# Patient Record
Sex: Male | Born: 1980 | Race: Black or African American | Hispanic: No | Marital: Single | Smoking: Current every day smoker
Health system: Southern US, Community
[De-identification: ages and names within clinical notes are randomized; demographics above are authoritative.]

---

## 2020-05-13 ENCOUNTER — Emergency Department: Payer: Self-pay

## 2020-05-13 ENCOUNTER — Encounter: Payer: Self-pay | Admitting: Emergency Medicine

## 2020-05-13 ENCOUNTER — Other Ambulatory Visit: Payer: Self-pay

## 2020-05-13 ENCOUNTER — Emergency Department
Admission: EM | Admit: 2020-05-13 | Discharge: 2020-05-13 | Disposition: A | Discharge status: Home / Self Care | Payer: Self-pay | Attending: Emergency Medicine | Admitting: Emergency Medicine

## 2020-05-13 DIAGNOSIS — F17210 Nicotine dependence, cigarettes, uncomplicated: Secondary | ICD-10-CM | POA: Insufficient documentation

## 2020-05-13 DIAGNOSIS — M25552 Pain in left hip: Secondary | ICD-10-CM | POA: Insufficient documentation

## 2020-05-13 DIAGNOSIS — R52 Pain, unspecified: Secondary | ICD-10-CM

## 2020-05-13 MED ORDER — KETOROLAC TROMETHAMINE 30 MG/ML IJ SOLN
30.0000 mg | Freq: Once | INTRAMUSCULAR | Status: AC
  Administered 2020-05-13: 30 mg via INTRAMUSCULAR
  Filled 2020-05-13: qty 1

## 2020-05-13 MED ORDER — NAPROXEN 500 MG PO TABS: 500.0000 mg | ORAL_TABLET | Freq: Two times a day (BID) | ORAL | 0 refills | Status: DC

## 2020-05-13 NOTE — ED Notes (Signed)
PA at bedside.

## 2020-05-13 NOTE — ED Provider Notes (Signed)
[facility] Emergency Department Provider Note  ____________________________________________   First MD Initiated Contact with Patient 05/13/20 [phone]     (approximate)  I have reviewed the triage vital signs and the nursing notes.   HISTORY  Chief Complaint Leg Pain   HPI Eric Krause is a 39 y.o. male presents to the ED with complaint of left leg pain that started 2 days ago.  Patient states that he was at work when he felt a slight pain which by the end of his work day was much worse.  Patient states that he went home and put his leg up and the next morning his leg was much worse and he was unable to go to work.  He denies any injury to his leg post past and recently.  He has not taken any over-the-counter medication for his leg pain.  He rates his pain as 10/10.     History reviewed. No pertinent past medical history.  There are no problems to display for this patient.   Prior to Admission medications   Medication Sig Start Date End Date Taking? Authorizing Provider  naproxen (NAPROSYN) 500 MG tablet Take 1 tablet (500 mg total) by mouth 2 (two) times daily with a meal. 05/13/20   Tommi Rumps, PA-C    Allergies Patient has no known allergies.  History reviewed. No pertinent family history.  Social History Social History   Tobacco Use  . Smoking status: Current Every Day Smoker    Types: Cigarettes, Cigars  . Smokeless tobacco: Never Used  Substance Use Topics  . Alcohol use: Not on file    Comment: occ.  . Drug use: Never    Review of Systems Constitutional: No fever/chills Eyes: No visual changes. ENT: No sore throat. Cardiovascular: Denies chest pain. Respiratory: Denies shortness of breath. Gastrointestinal: No abdominal pain.  No nausea, no vomiting.  No diarrhea.  Genitourinary: Negative for dysuria. Musculoskeletal: Positive for left lower extremity pain. Skin: Negative for rash. Neurological: Negative for  headaches, focal weakness or numbness. ____________________________________________   PHYSICAL EXAM:  VITAL SIGNS: ED Triage Vitals  Enc Vitals Group     BP 05/13/20 0550 139/88     Pulse Rate 05/13/20 0550 79     Resp 05/13/20 0550 16     Temp 05/13/20 0550 98.1 F (36.7 C)     Temp Source 05/13/20 0550 Oral     SpO2 05/13/20 0550 100 %     Weight 05/13/20 0549 180 lb (81.6 kg)     Height 05/13/20 0549 5\' 8"  (1.727 m)     Head Circumference --      Peak Flow --      Pain Score 05/13/20 0549 10     Pain Loc --      Pain Edu? --      Excl. in GC? --    Constitutional: Alert and oriented. Well appearing and in no acute distress.  Patient does scream with movement and according to nurse had difficulty getting from the wheelchair into the stretcher.  When this provider went into the room to examine the patient he was standing by the bedside stating that he could not sit or lie on the stretcher as it increased his pain. Eyes: Conjunctivae are normal.  Head: Atraumatic. Nose: No congestion/rhinnorhea. Neck: No stridor.   Cardiovascular: Normal rate, regular rhythm. Grossly normal heart sounds.  Good peripheral circulation. Respiratory: Normal respiratory effort.  No retractions. Lungs CTAB. Gastrointestinal: Soft and nontender. No  distention. No abdominal bruits.  Sounds normoactive x4 quadrants. Musculoskeletal: On examination of the left lower extremity there is no gross deformity or soft tissue edema present.  No warmth or erythema is appreciated.  Denna Haggard' sign was negative.  There is point tenderness on palpation of the left hip generalized laterally.  Range of motion is difficult as patient is screaming.  No crepitus is appreciated.  Pulses are present distally and skin is intact.  Patient is guarding with any attempt to test range of motion.  No edema is present. Neurologic:  Normal speech and language. No gross focal neurologic deficits are appreciated.  Gait was not tested due to  pain. Skin:  Skin is warm, dry and intact. No rash noted. Psychiatric: Mood and affect are normal. Speech and behavior are normal.  ____________________________________________   LABS (all labs ordered are listed, but only abnormal results are displayed)  Labs Reviewed - No data to display ____________________________________________  RADIOLOGY I, Tommi Rumps, personally viewed and evaluated these images (plain radiographs) as part of my medical decision making, as well as reviewing the written report by the radiologist.    Official radiology report(s): US Venous Img Lower Unilateral Left  Result Date: 05/13/2020 CLINICAL DATA:  Left lower extremity pain and swelling. EXAM: Left LOWER EXTREMITY VENOUS DOPPLER ULTRASOUND TECHNIQUE: Gray-scale sonography with compression, as well as color and duplex ultrasound, were performed to evaluate the deep venous system(s) from the level of the common femoral vein through the popliteal and proximal calf veins. COMPARISON:  None. FINDINGS: VENOUS Normal compressibility of the common femoral, superficial femoral, and popliteal veins, as well as the visualized calf veins. Visualized portions of profunda femoral vein and great saphenous vein unremarkable. No filling defects to suggest DVT on grayscale or color Doppler imaging. Doppler waveforms show normal direction of venous flow, normal respiratory plasticity and response to augmentation. Limited views of the contralateral common femoral vein are unremarkable. OTHER None. Limitations: none IMPRESSION: Negative venous Doppler examination for left lower extremity deep venous thrombosis. Electronically Signed   By: Rudie Meyer M.D.   On: 05/13/2020 06:22   DG HIP UNILAT WITH PELVIS 2-3 VIEWS LEFT  Result Date: 05/13/2020 CLINICAL DATA:  Left hip pain EXAM: DG HIP (WITH OR WITHOUT PELVIS) 2-3V LEFT COMPARISON:  None. FINDINGS: There is no evidence of hip fracture or dislocation. There is no evidence of  arthropathy or other focal bone abnormality. IMPRESSION: Negative. Electronically Signed   By: Duanne Guess D.O.   On: 05/13/2020 08:54    ____________________________________________   PROCEDURES  Procedure(s) performed (including Critical Care):  Procedures   ____________________________________________   INITIAL IMPRESSION / ASSESSMENT AND PLAN / ED COURSE  As part of my medical decision making, I reviewed the following data within the electronic MEDICAL RECORD NUMBER Notes from prior ED visits and [place] Controlled Substance Database  ----------------------------------------- 9:01 AM on 05/13/2020 ----------------------------------------- Patient reports decreased pain at this time and is currently lying on stretcher with eyes closed and appears to be more comfortable.  39 year old male presents to the ED with complaint of left leg pain that began 2 days ago without history of injury.  Patient states that he has had swelling with his pain.  There has been no history of injury or previous leg problems.  Patient does not take any over-the-counter medication.  He was given Toradol 30 mg IM after his exam.  He was markedly tender to palpation left lateral hip which was increased with any type of range  of motion and exam was limited due to patient's pain.  Ultrasound was done prior to patient exam and ruled out a DVT.  X-rays of his left hip and pelvis was negative for any acute bony injury.  At the time of discharge patient was considerably improved and was ambulatory without any assistance.  A prescription for naproxen 500 mg twice daily was sent to his pharmacy to continue taking.  He is to follow-up with Dr. Martha Clan if any continued problems with his left hip.  Patient also requested a note to remain out of work today. ____________________________________________   FINAL CLINICAL IMPRESSION(S) / ED DIAGNOSES  Final diagnoses:  Pain  Acute pain of left hip     ED Discharge Orders          Ordered    naproxen (NAPROSYN) 500 MG tablet  2 times daily with meals        05/13/20 7078          *Please note:  Garvey Westcott was evaluated in Emergency Department on 05/13/2020 for the symptoms described in the history of present illness. He was evaluated in the context of the global COVID-19 pandemic, which necessitated consideration that the patient might be at risk for infection with the SARS-CoV-2 virus that causes COVID-19. Institutional protocols and algorithms that pertain to the evaluation of patients at risk for COVID-19 are in a state of rapid change based on information released by regulatory bodies including the CDC and federal and state organizations. These policies and algorithms were followed during the patient's care in the ED.  Some ED evaluations and interventions may be delayed as a result of limited staffing during and the pandemic.*   Note:  This document was prepared using Dragon voice recognition software and may include unintentional dictation errors.    Tommi Rumps, PA-C 05/13/20 1157    Chesley Noon, MD 05/13/20 [id number]

## 2020-05-13 NOTE — ED Triage Notes (Signed)
Patient with complaint of pain and swelling to left leg that started in upper leg on Saturday. Patient states that he now has pain down his whole leg. Patient states that he also has swelling. Patient denies any injury.

## 2020-05-13 NOTE — ED Notes (Signed)
Pt taken to xray 

## 2020-05-13 NOTE — Discharge Instructions (Signed)
Follow-up with your primary care provider or Dr. Martha Clan who is at [facility] if any continued problems with your left leg or hip.  A prescription for naproxen was sent to your pharmacy.  This medication is twice a day with food every day.  You may use ice or heat to your hip as needed for discomfort.

## 2020-11-28 ENCOUNTER — Ambulatory Visit: Payer: Self-pay

## 2021-03-20 ENCOUNTER — Emergency Department
Admission: EM | Admit: 2021-03-20 | Discharge: 2021-03-20 | Disposition: A | Discharge status: Home / Self Care | Payer: Self-pay | Attending: Emergency Medicine | Admitting: Emergency Medicine

## 2021-03-20 ENCOUNTER — Other Ambulatory Visit: Payer: Self-pay

## 2021-03-20 DIAGNOSIS — M67432 Ganglion, left wrist: Secondary | ICD-10-CM | POA: Insufficient documentation

## 2021-03-20 DIAGNOSIS — F17210 Nicotine dependence, cigarettes, uncomplicated: Secondary | ICD-10-CM | POA: Insufficient documentation

## 2021-03-20 NOTE — Discharge Instructions (Addendum)
Follow-up with [facility].  Please call for an appointment.  Apply ice to the area.  Take ibuprofen for pain as needed.

## 2021-03-20 NOTE — ED Triage Notes (Signed)
Pt comes with wrist pain in left arm for about 2 weeks. Pt has noticeable knot or cyst located at wrist. No drainage noted.

## 2021-03-20 NOTE — ED Notes (Signed)
See triage note  presents with pain to left wrist  states pain started about 2 weeks ago

## 2021-03-20 NOTE — ED Provider Notes (Signed)
[facility] Emergency Department Provider Note  ____________________________________________   Event Date/Time   First MD Initiated Contact with Patient 03/20/21 1635     (approximate)  I have reviewed the triage vital signs and the nursing notes.   HISTORY  Chief Complaint Wrist Pain    HPI Eric Krause is a 40 y.o. male presents emergency department complaining of left arm pain for about 2 weeks.  Patient states he has not that comes and goes.  Is gotten much larger in the past 2 weeks.  No known injury.  No numbness or tingling.  History reviewed. No pertinent past medical history.  There are no problems to display for this patient.   History reviewed. No pertinent surgical history.  Prior to Admission medications   Not on File    Allergies Patient has no known allergies.  No family history on file.  Social History Social History   Tobacco Use   Smoking status: Every Day    Types: Cigarettes, Cigars   Smokeless tobacco: Never  Substance Use Topics   Drug use: Never    Review of Systems  Constitutional: No fever/chills Eyes: No visual changes. ENT: No sore throat. Respiratory: Denies cough Genitourinary: Negative for dysuria. Musculoskeletal: Negative for back pain. Skin: Negative for rash. Psychiatric: no mood changes,     ____________________________________________   PHYSICAL EXAM:  VITAL SIGNS: ED Triage Vitals  Enc Vitals Group     BP 03/20/21 1642 140/80     Pulse Rate 03/20/21 1641 70     Resp 03/20/21 1641 18     Temp 03/20/21 1641 98 F (36.7 C)     Temp Source 03/20/21 1641 Oral     SpO2 03/20/21 1641 98 %     Weight 03/20/21 1640 179 lb 14.3 oz (81.6 kg)     Height 03/20/21 1640 5\' 8"  (1.727 m)     Head Circumference --      Peak Flow --      Pain Score 03/20/21 1603 8     Pain Loc --      Pain Edu? --      Excl. in GC? --     Constitutional: Alert and oriented. Well appearing and in no  acute distress. Eyes: Conjunctivae are normal.  Head: Atraumatic. Nose: No congestion/rhinnorhea. Mouth/Throat: Mucous membranes are moist.   Neck:  supple no lymphadenopathy noted Cardiovascular: Normal rate, regular rhythm.  Respiratory: Normal respiratory effort.  No retractions,  GU: deferred Musculoskeletal: FROM all extremities, warm and well perfused, ganglion cyst noted on the volar surface of the left wrist, neurovascular is intact Neurologic:  Normal speech and language.  Skin:  Skin is warm, dry and intact. No rash noted. Psychiatric: Mood and affect are normal. Speech and behavior are normal.  ____________________________________________   LABS (all labs ordered are listed, but only abnormal results are displayed)  Labs Reviewed - No data to display ____________________________________________   ____________________________________________  RADIOLOGY    ____________________________________________   PROCEDURES  Procedure(s) performed: No  Procedures    ____________________________________________   INITIAL IMPRESSION / ASSESSMENT AND PLAN / ED COURSE  Pertinent labs & imaging results that were available during my care of the patient were reviewed by me and considered in my medical decision making (see chart for details).   Patient is a 40 year old male presents with a ganglion cyst to the left wrist.  See HPI.  Physical exam is consistent with same.  Patient was given a wrist brace.  He is taking over-the-counter anti-inflammatories.  Follow-up with [facility].  Apply ice.  Return if worsening.     Eric Krause was evaluated in Emergency Department on 03/20/2021 for the symptoms described in the history of present illness. He was evaluated in the context of the global COVID-19 pandemic, which necessitated consideration that the patient might be at risk for infection with the SARS-CoV-2 virus that causes COVID-19. Institutional protocols and  algorithms that pertain to the evaluation of patients at risk for COVID-19 are in a state of rapid change based on information released by regulatory bodies including the CDC and federal and state organizations. These policies and algorithms were followed during the patient's care in the ED.    As part of my medical decision making, I reviewed the following data within the electronic MEDICAL RECORD NUMBER Nursing notes reviewed and incorporated, Old chart reviewed, Notes from prior ED visits, and [place] Controlled Substance Database  ____________________________________________   FINAL CLINICAL IMPRESSION(S) / ED DIAGNOSES  Final diagnoses:  Ganglion cyst of dorsum of left wrist      NEW MEDICATIONS STARTED DURING THIS VISIT:  New Prescriptions   No medications on file     Note:  This document was prepared using Dragon voice recognition software and may include unintentional dictation errors.    Faythe Ghee, PA-C 03/20/21 1648    Sharman Cheek, MD 03/20/21 1942

## 2022-02-22 IMAGING — US US EXTREM LOW VENOUS*L*
1 series · 14 of 24 positions shown · non-contrast
Comparison: None.

CLINICAL DATA: Left lower extremity pain and swelling.

EXAM:
Left LOWER EXTREMITY VENOUS DOPPLER ULTRASOUND
TECHNIQUE: Gray-scale sonography with compression, as well as color and duplex
ultrasound, were performed to evaluate the deep venous system(s)
from the level of the common femoral vein through the popliteal and
proximal calf veins.

[Series 1: us venous img lower uni left (dvt) · portal-venous · 14 of 30 slices shown]
[im 1/30]
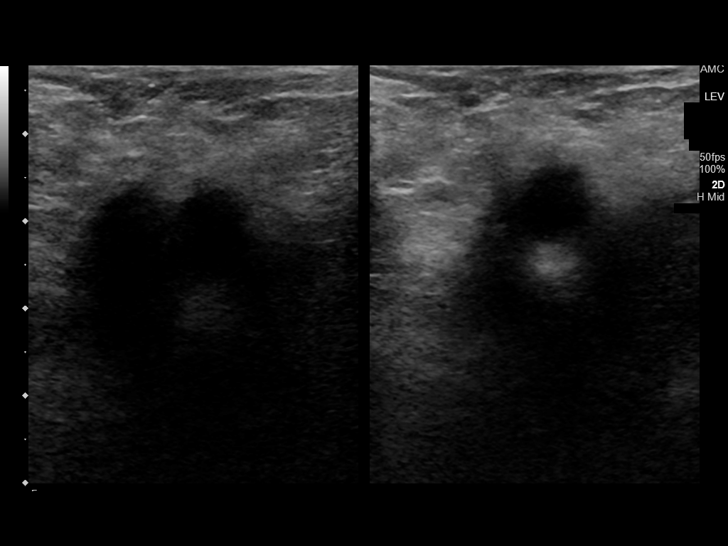
[im 3/30]
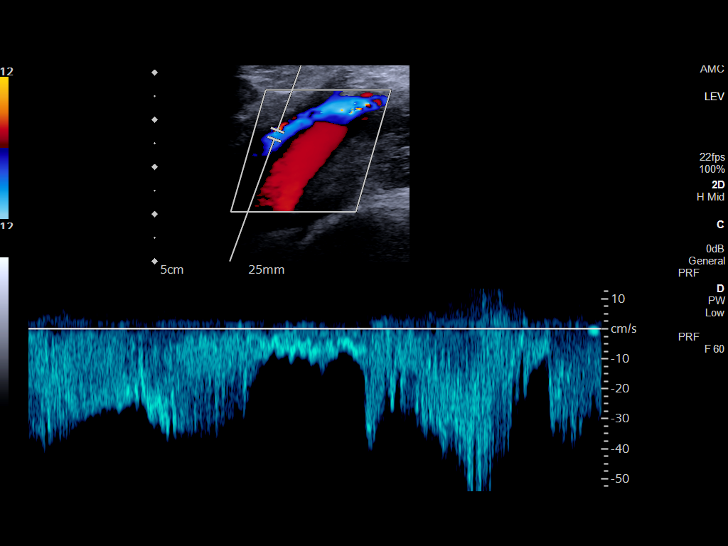
[im 6/30]
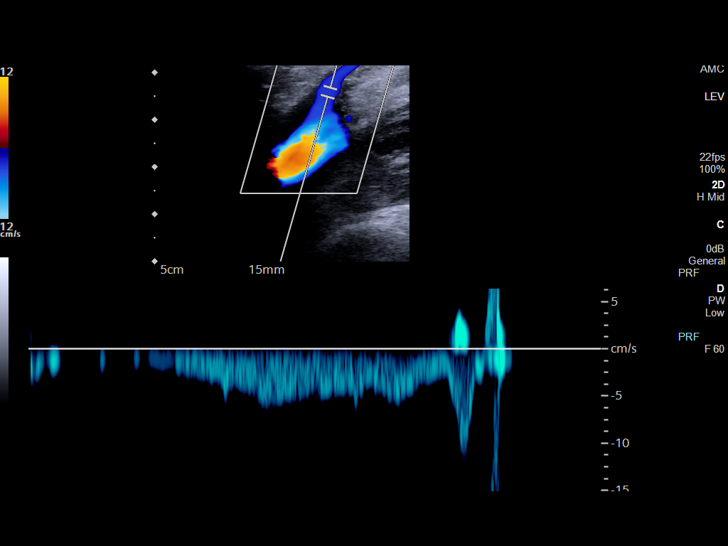
[im 8/30]
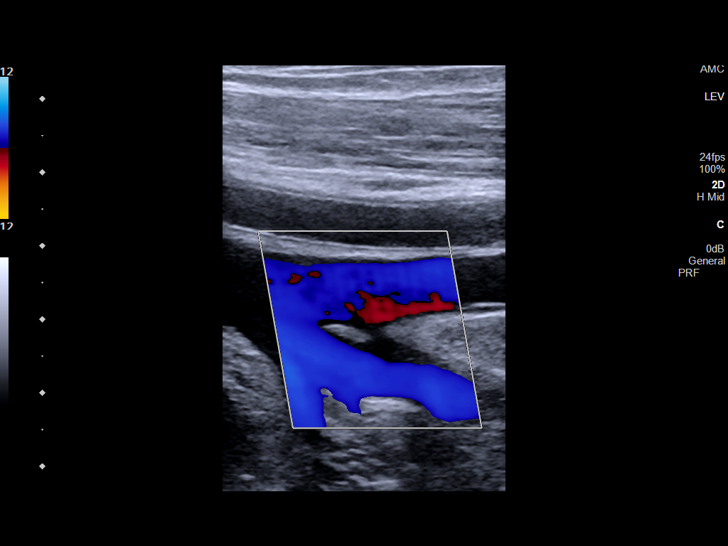
[im 9/30]
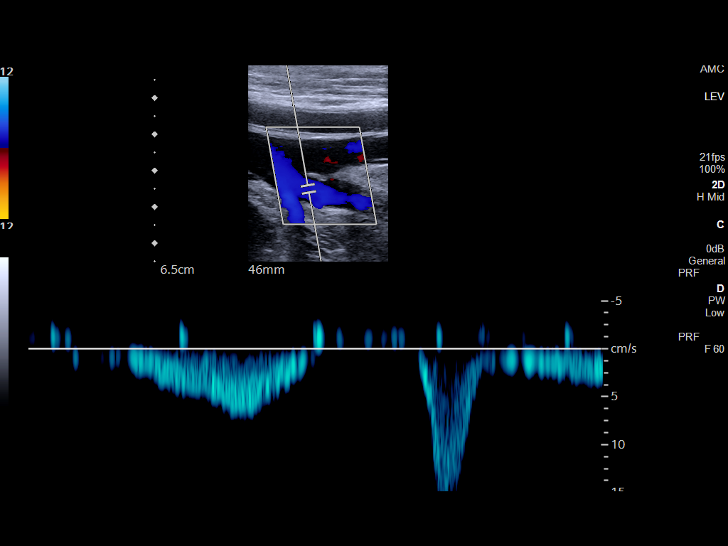
[im 12/30]
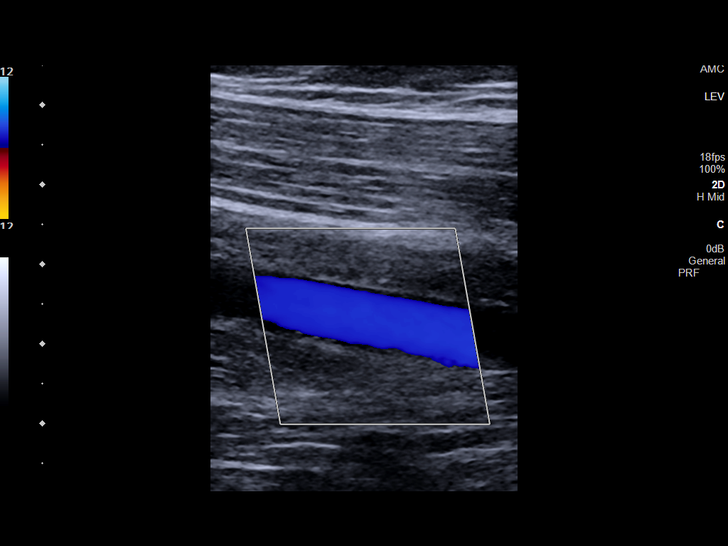
[im 14/30]
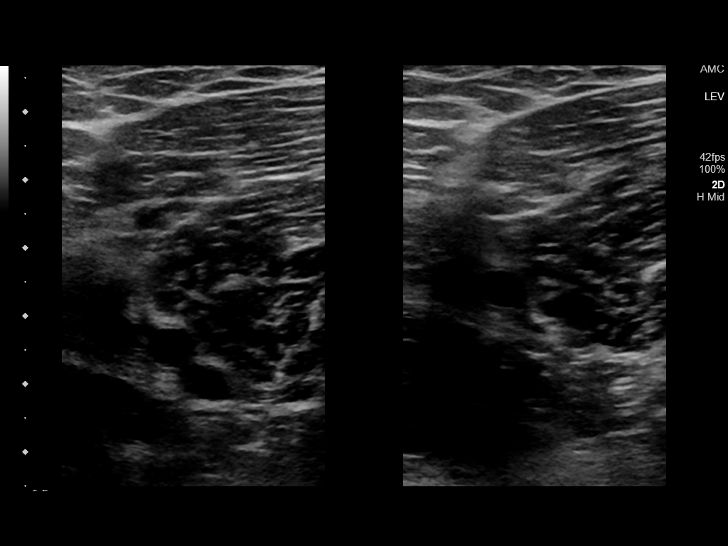
[im 16/30]
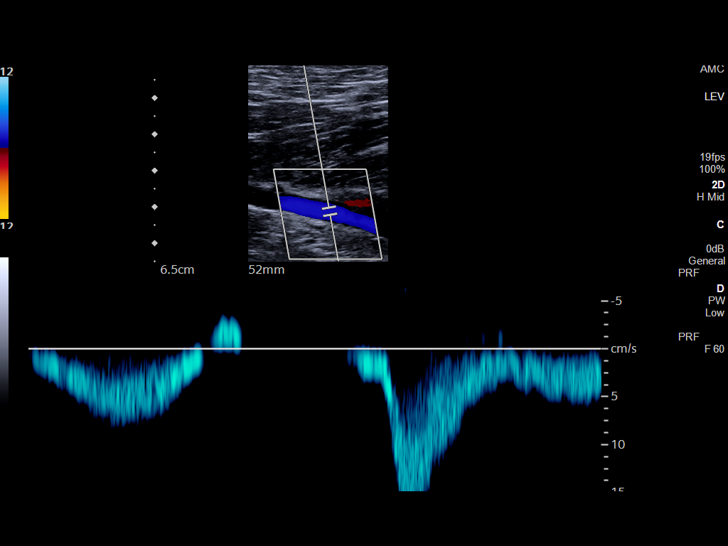
[im 18/30]
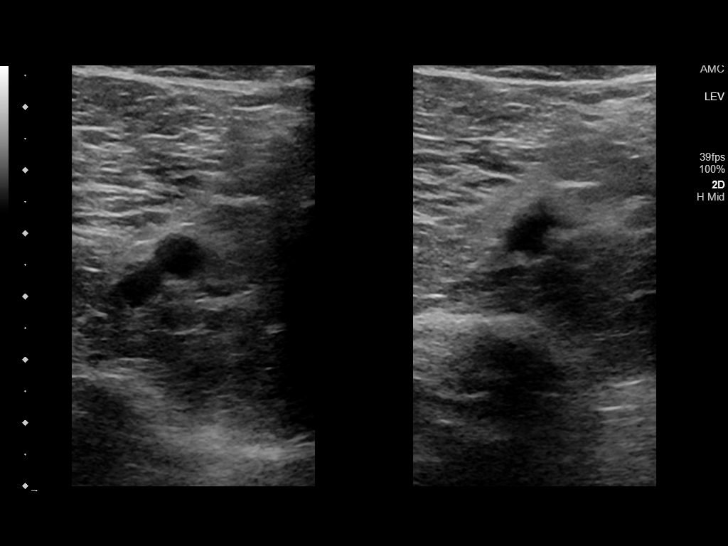
[im 21/30]
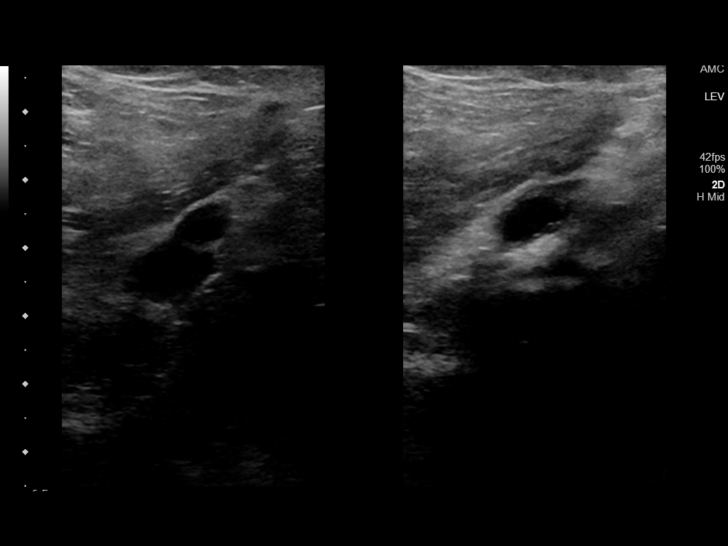
[im 23/30]
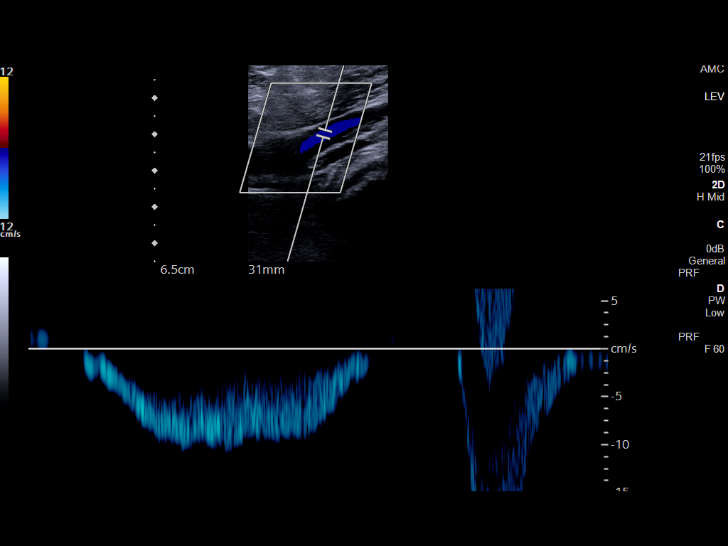
[im 24/30]
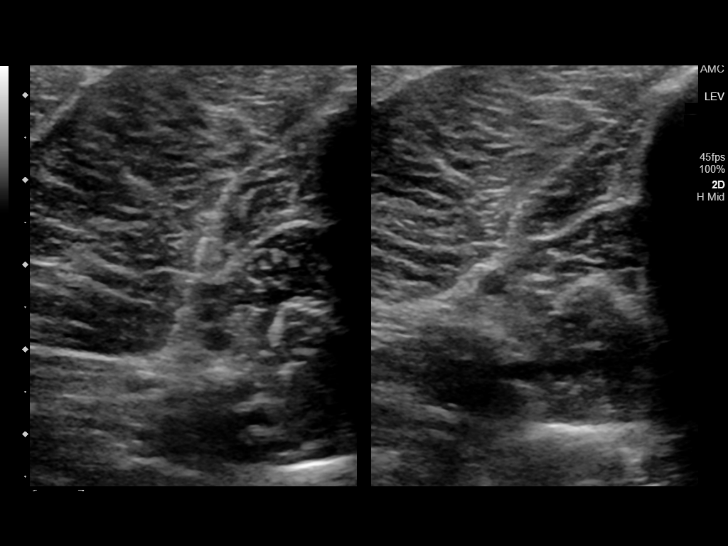
[im 27/30]
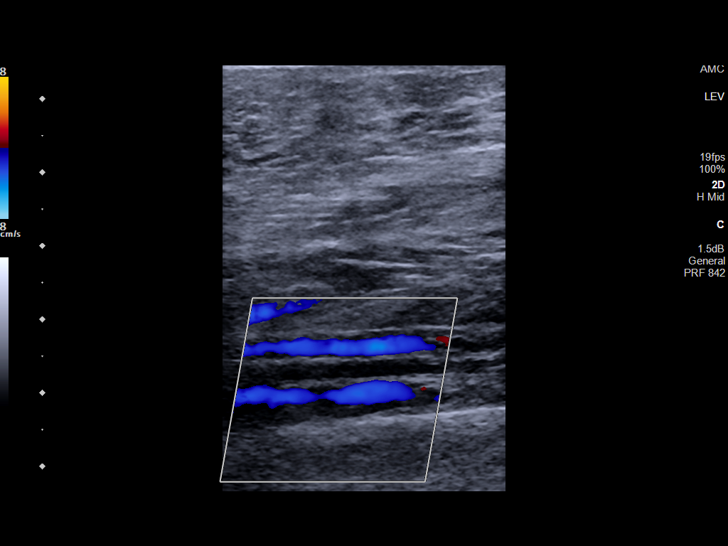
[im 30/30]
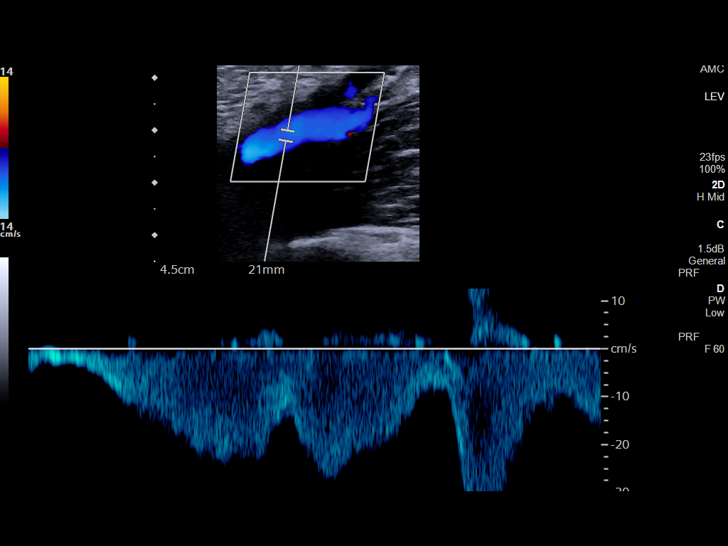

[14 of 24 positions shown; findings below may reference images not displayed]

FINDINGS: VENOUS

Normal compressibility of the common femoral, superficial femoral,
and popliteal veins, as well as the visualized calf veins.
Visualized portions of profunda femoral vein and great saphenous
vein unremarkable. No filling defects to suggest DVT on grayscale or
color Doppler imaging. Doppler waveforms show normal direction of
venous flow, normal respiratory plasticity and response to
augmentation.

Limited views of the contralateral common femoral vein are
unremarkable.

OTHER

None.

Limitations: none
IMPRESSION: Negative venous Doppler examination for left lower extremity deep
venous thrombosis.
